# Patient Record
Sex: Male | Born: 2000 | Race: White | Hispanic: No | Marital: Single | Smoking: Never smoker
Health system: Southern US, Community
[De-identification: ages and names within clinical notes are randomized; demographics above are authoritative.]

---
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
## 2021-05-03 ENCOUNTER — Ambulatory Visit
Admission: RE | Admit: 2021-05-03 | Discharge: 2021-05-03 | Disposition: A | Discharge status: Home / Self Care | Payer: BLUE CROSS/BLUE SHIELD | Source: Ambulatory Visit

## 2021-05-03 ENCOUNTER — Other Ambulatory Visit: Payer: Self-pay

## 2021-05-03 VITALS — BP 132/74 | HR 76 | Temp 98.2°F | Resp 16

## 2021-05-03 DIAGNOSIS — L239 Allergic contact dermatitis, unspecified cause: Secondary | ICD-10-CM

## 2021-05-03 MED ORDER — METHYLPREDNISOLONE SODIUM SUCC 125 MG IJ SOLR
80.0000 mg | Freq: Once | INTRAMUSCULAR | Status: AC
  Administered 2021-05-03: 80 mg via INTRAMUSCULAR

## 2021-05-03 MED ORDER — PREDNISONE 10 MG (21) PO TBPK: ORAL_TABLET | Freq: Every day | ORAL | 0 refills | Status: AC

## 2021-05-03 NOTE — ED Provider Notes (Signed)
Renaldo Fiddler    CSN: [id number] Arrival date & time: 05/03/21  1114      History   Chief Complaint Chief Complaint  Patient presents with   Rash   APPT 1415    HPI Micheal Hernandez is a 20 y.o. male.  Patient presents with pruritic rash on his trunk and extremities x2 days.  The rash started after he came in contact with a tree while attending a bonfire at school.  The rash is spreading and has now spread to his face.  No lesions in his eyes or mouth.  He reports history of similar rashes in the past when he came in contact with poison oak or poison ivy.  He denies difficulty swallowing or breathing.  He denies fever, chills, or other symptoms.  He was seen at another urgent care yesterday and states he was treated with Benadryl, Pepcid, and Allegra; he states he received a call from the urgent care and was told that the provider also prescribed prednisone; he has not picked this medicine up from the pharmacy because he preferred to be seen here today.  He denies other pertinent medical history.  The history is provided by the patient.   History reviewed. No pertinent past medical history.  There are no problems to display for this patient.   History reviewed. No pertinent surgical history.     Home Medications    Prior to Admission medications   Medication Sig Start Date End Date Taking? Authorizing Provider  predniSONE (STERAPRED UNI-PAK 21 TAB) 10 MG (21) TBPK tablet Take by mouth daily. As directed 05/04/21  Yes Mickie Bail, NP  albuterol (VENTOLIN HFA) 108 (90 Base) MCG/ACT inhaler Inhale 2 puffs into the lungs every 6 (six) hours as needed for wheezing or shortness of breath. 08/04/20   Enid Derry, PA-C  brompheniramine-pseudoephedrine-DM 30-2-10 MG/5ML syrup Take 5 mLs by mouth 4 (four) times daily as needed. 08/04/20   Enid Derry, PA-C    Family History History reviewed. No pertinent family history.  Social History Social History   Tobacco Use    Smoking status: Never   Smokeless tobacco: Never  Substance Use Topics   Alcohol use: Yes   Drug use: Never     Allergies   Patient has no known allergies.   Review of Systems Review of Systems  Constitutional:  Negative for chills and fever.  HENT:  Negative for ear pain and sore throat.   Respiratory:  Negative for cough and shortness of breath.   Cardiovascular:  Negative for chest pain and palpitations.  Gastrointestinal:  Negative for abdominal pain and vomiting.  Skin:  Positive for rash. Negative for color change.  All other systems reviewed and are negative.   Physical Exam Triage Vital Signs ED Triage Vitals  Enc Vitals Group     BP      Pulse      Resp      Temp      Temp src      SpO2      Weight      Height      Head Circumference      Peak Flow      Pain Score      Pain Loc      Pain Edu?      Excl. in GC?    No data found.  Updated Vital Signs BP 132/74 (BP Location: Left Arm)   Pulse 76   Temp 98.2 F (36.8 C) (  Oral)   Resp 16   SpO2 95%   Visual Acuity Right Eye Distance:   Left Eye Distance:   Bilateral Distance:    Right Eye Near:   Left Eye Near:    Bilateral Near:     Physical Exam Vitals and nursing note reviewed.  Constitutional:      General: He is not in acute distress.    Appearance: He is well-developed.  HENT:     Head: Normocephalic and atraumatic.     Mouth/Throat:     Mouth: Mucous membranes are moist.     Pharynx: Oropharynx is clear.  Eyes:     Conjunctiva/sclera: Conjunctivae normal.  Cardiovascular:     Rate and Rhythm: Normal rate and regular rhythm.     Heart sounds: Normal heart sounds.  Pulmonary:     Effort: Pulmonary effort is normal. No respiratory distress.     Breath sounds: Normal breath sounds.  Abdominal:     Palpations: Abdomen is soft.     Tenderness: There is no abdominal tenderness.  Musculoskeletal:     Cervical back: Neck supple.  Skin:    General: Skin is warm and dry.      Findings: Rash present.     Comments: Red patchy and papular rash on face, trunk, and extremities.  No open wounds or drainage.  No lesions in eyes or mouth.  Neurological:     General: No focal deficit present.     Mental Status: He is alert and oriented to person, place, and time.     Gait: Gait normal.  Psychiatric:        Mood and Affect: Mood normal.        Behavior: Behavior normal.     UC Treatments / Results  Labs (all labs ordered are listed, but only abnormal results are displayed) Labs Reviewed - No data to display  EKG   Radiology No results found.  Procedures Procedures (including critical care time)  Medications Ordered in UC Medications  methylPREDNISolone sodium succinate (SOLU-MEDROL) 125 mg/2 mL injection 80 mg (80 mg Intramuscular Given 05/03/21 1148)    Initial Impression / Assessment and Plan / UC Course  I have reviewed the triage vital signs and the nursing notes.  Pertinent labs & imaging results that were available during my care of the patient were reviewed by me and considered in my medical decision making (see chart for details).  Allergic contact dermatitis.  Solu-Medrol given here and starting prednisone taper tomorrow.  Instructed patient to take Benadryl every 6 hours and precautions for drowsiness with this medication discussed.  Instructed patient to take Allegra during the day if he needs to be awake and alert.  ED precautions discussed.  Education provided on contact dermatitis.  Instructed patient to follow-up with his PCP if his symptoms are not improving.  Patient agrees to plan of care.   Final Clinical Impressions(s) / UC Diagnoses   Final diagnoses:  Allergic contact dermatitis, unspecified trigger     Discharge Instructions      You were given an injection of a steroid called Solu-Medrol.  Start the prednisone taper tomorrow as directed.    Take Benadryl every 6 hours as directed; do not drive, operate machinery, or drink  alcohol with this medication as it may cause drowsiness.   If you need to be awake and alert, take Allegra during the day and Benadryl at bedtime.    Go to the emergency department if you have difficulty swallowing or  breathing.         ED Prescriptions     Medication Sig Dispense Auth. Provider   predniSONE (STERAPRED UNI-PAK 21 TAB) 10 MG (21) TBPK tablet Take by mouth daily. As directed 21 tablet Mickie Bail, NP      PDMP not reviewed this encounter.   Mickie Bail, NP 05/03/21 1159

## 2021-05-03 NOTE — ED Triage Notes (Signed)
Patient c/o Rash x 2 days.   Patient endorses onset of symptoms " may have started after coming into contact with a low hanging tree".  Patient denies fever or recent illness.   Patient endorses rash started on neck and has now spread to ABD , chest, and both arms bilaterally.   Patient endorses eyes are now affected by rash.   Patient endorses itchiness.   Patient was seen at another UC and given Pepcid, allegra, and benadryl w/ no relief of symptoms.

## 2021-05-03 NOTE — Discharge Instructions (Addendum)
You were given an injection of a steroid called Solu-Medrol.  Start the prednisone taper tomorrow as directed.    Take Benadryl every 6 hours as directed; do not drive, operate machinery, or drink alcohol with this medication as it may cause drowsiness.   If you need to be awake and alert, take Allegra during the day and Benadryl at bedtime.    Go to the emergency department if you have difficulty swallowing or breathing.

## 2021-09-19 IMAGING — CR DG CHEST 2V
1 series · 2 of 2 positions shown · non-contrast
Comparison: None.

CLINICAL DATA: Cough, shortness of breath and fever.

EXAM:
CHEST - 2 VIEW

[Series 1: w chest pa · 0.14mm/px · 2 of 2 slices shown]
[im 1/2]
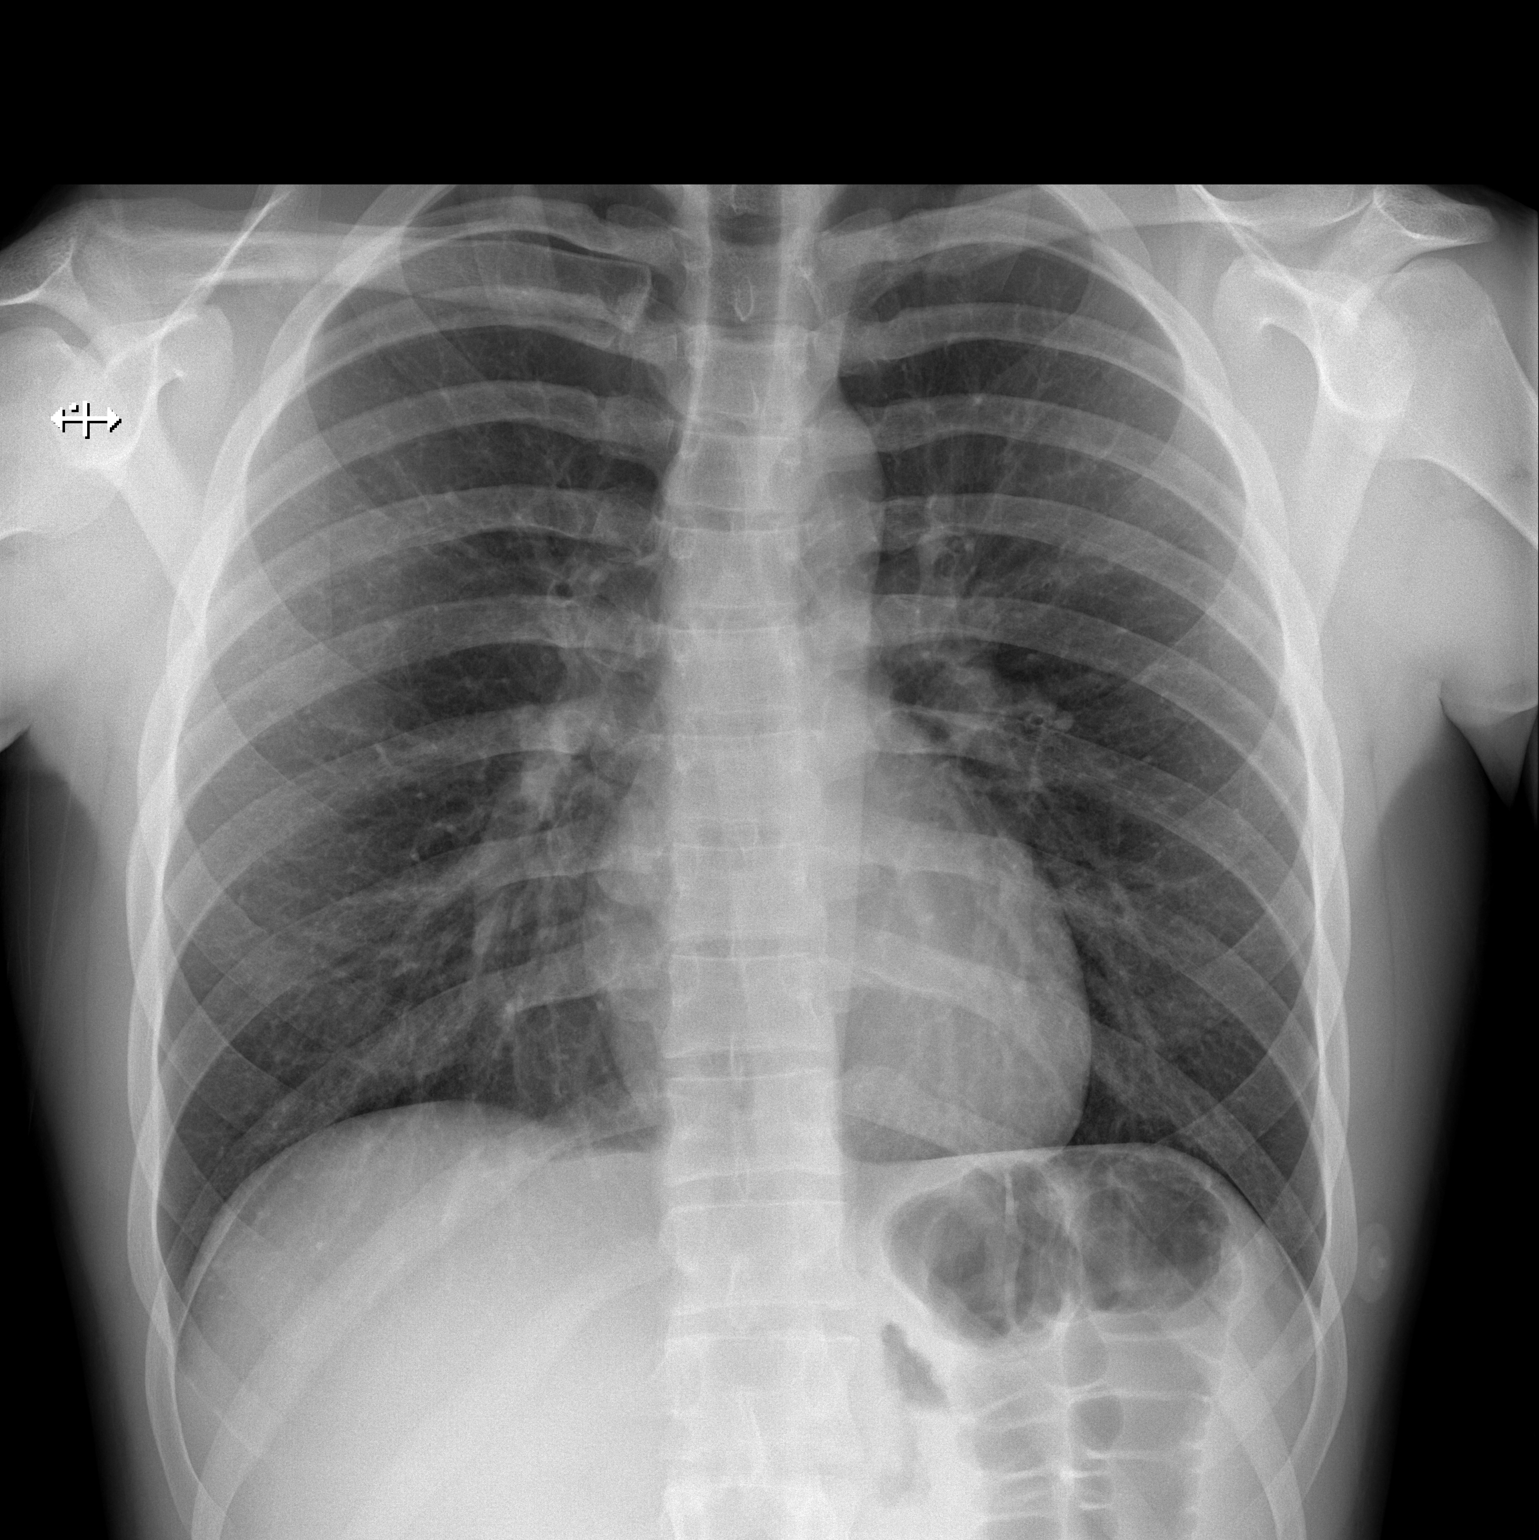
[im 2/2]
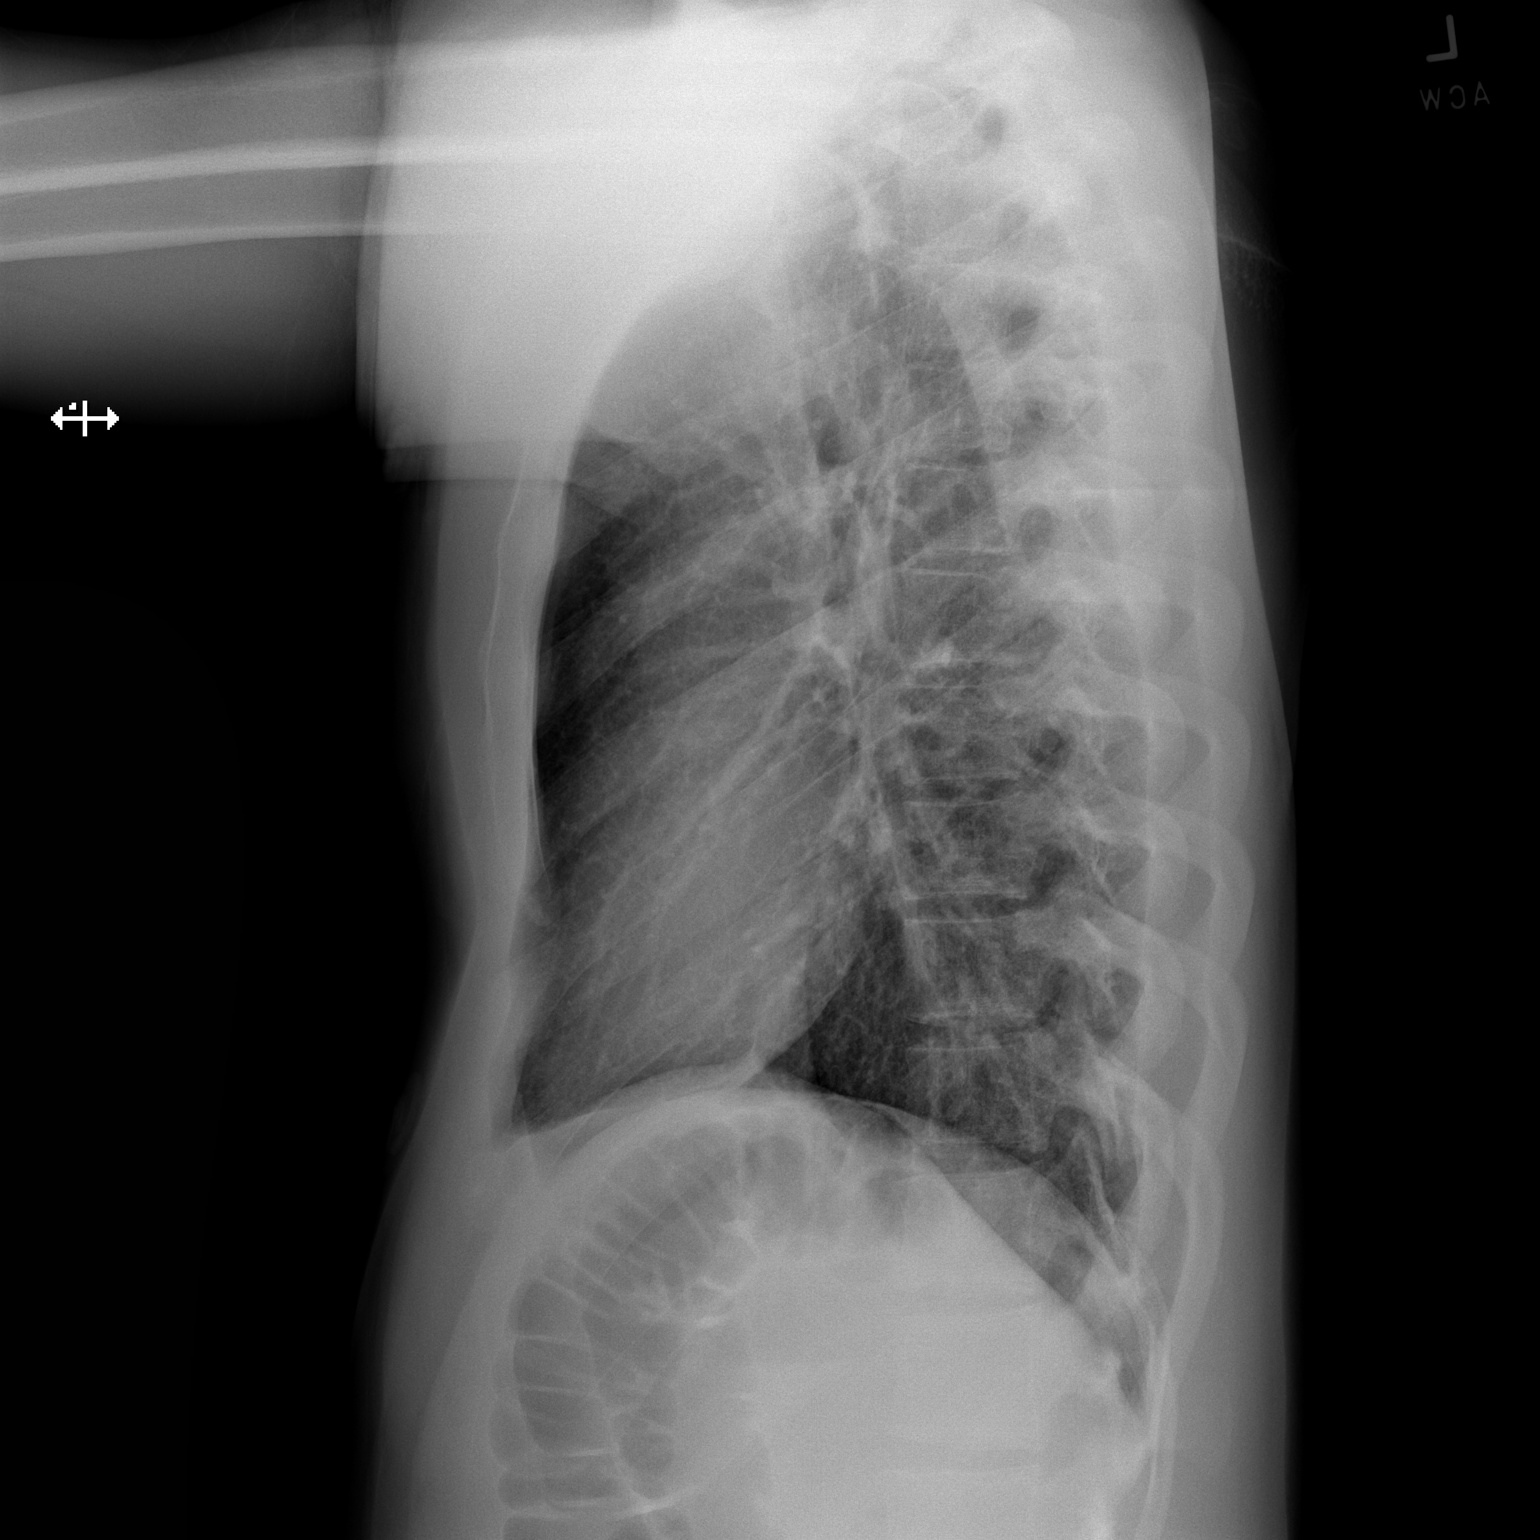

[2 of 2 positions shown; findings below may reference images not displayed]

FINDINGS: The heart size and mediastinal contours are within normal limits.
Both lungs are clear. The visualized skeletal structures are
unremarkable.
IMPRESSION: No focal airspace disease.
